# Patient Record
Sex: Female | Born: 1965 | Race: White | Hispanic: No | Marital: Married | Smoking: Never smoker
Health system: Southern US, Community
[De-identification: ages and names within clinical notes are randomized; demographics above are authoritative.]

---

## 2004-12-29 ENCOUNTER — Inpatient Hospital Stay: Payer: Self-pay | Admitting: Obstetrics and Gynecology

## 2005-01-05 ENCOUNTER — Ambulatory Visit: Payer: Self-pay | Admitting: Pediatrics

## 2006-02-20 ENCOUNTER — Ambulatory Visit: Payer: Self-pay | Admitting: Obstetrics and Gynecology

## 2008-05-29 ENCOUNTER — Ambulatory Visit: Payer: Self-pay | Admitting: Obstetrics and Gynecology

## 2008-06-10 ENCOUNTER — Ambulatory Visit: Payer: Self-pay | Admitting: Obstetrics and Gynecology

## 2009-06-18 ENCOUNTER — Ambulatory Visit: Payer: Self-pay | Admitting: Obstetrics and Gynecology

## 2010-06-29 ENCOUNTER — Ambulatory Visit: Payer: Self-pay | Admitting: Obstetrics and Gynecology

## 2010-07-13 ENCOUNTER — Ambulatory Visit: Payer: Self-pay | Admitting: Obstetrics and Gynecology

## 2011-08-29 ENCOUNTER — Ambulatory Visit: Payer: Self-pay | Admitting: Obstetrics and Gynecology

## 2012-09-12 ENCOUNTER — Ambulatory Visit: Payer: Self-pay | Admitting: Obstetrics and Gynecology

## 2012-09-13 ENCOUNTER — Ambulatory Visit: Payer: Self-pay | Admitting: Obstetrics and Gynecology

## 2013-09-17 ENCOUNTER — Ambulatory Visit: Payer: Self-pay | Admitting: Obstetrics and Gynecology

## 2014-01-21 DIAGNOSIS — G43909 Migraine, unspecified, not intractable, without status migrainosus: Secondary | ICD-10-CM | POA: Insufficient documentation

## 2014-09-18 ENCOUNTER — Ambulatory Visit: Payer: Self-pay | Admitting: Obstetrics and Gynecology

## 2015-03-26 ENCOUNTER — Other Ambulatory Visit: Payer: Self-pay | Admitting: Student

## 2015-03-26 DIAGNOSIS — M5440 Lumbago with sciatica, unspecified side: Secondary | ICD-10-CM

## 2015-03-26 DIAGNOSIS — M5432 Sciatica, left side: Secondary | ICD-10-CM

## 2015-03-26 DIAGNOSIS — M5442 Lumbago with sciatica, left side: Secondary | ICD-10-CM

## 2015-04-01 ENCOUNTER — Ambulatory Visit
Admission: RE | Admit: 2015-04-01 | Discharge: 2015-04-01 | Disposition: A | Discharge status: Home / Self Care | Payer: BC Managed Care – PPO | Source: Ambulatory Visit | Attending: Student | Admitting: Student

## 2015-04-01 DIAGNOSIS — M5127 Other intervertebral disc displacement, lumbosacral region: Secondary | ICD-10-CM | POA: Insufficient documentation

## 2015-04-01 DIAGNOSIS — G544 Lumbosacral root disorders, not elsewhere classified: Secondary | ICD-10-CM | POA: Insufficient documentation

## 2015-04-01 DIAGNOSIS — M5432 Sciatica, left side: Secondary | ICD-10-CM

## 2015-04-01 DIAGNOSIS — M5442 Lumbago with sciatica, left side: Secondary | ICD-10-CM

## 2015-04-01 DIAGNOSIS — M5440 Lumbago with sciatica, unspecified side: Secondary | ICD-10-CM

## 2015-04-28 ENCOUNTER — Ambulatory Visit: Payer: BC Managed Care – PPO | Admitting: Anesthesiology

## 2015-04-29 ENCOUNTER — Ambulatory Visit: Payer: BC Managed Care – PPO | Admitting: Anesthesiology

## 2015-06-26 DIAGNOSIS — M5416 Radiculopathy, lumbar region: Secondary | ICD-10-CM | POA: Insufficient documentation

## 2015-06-26 DIAGNOSIS — M5126 Other intervertebral disc displacement, lumbar region: Secondary | ICD-10-CM | POA: Insufficient documentation

## 2015-08-12 ENCOUNTER — Other Ambulatory Visit: Payer: Self-pay | Admitting: Obstetrics and Gynecology

## 2015-08-12 DIAGNOSIS — Z1231 Encounter for screening mammogram for malignant neoplasm of breast: Secondary | ICD-10-CM

## 2015-09-22 ENCOUNTER — Ambulatory Visit
Admission: RE | Admit: 2015-09-22 | Discharge: 2015-09-22 | Disposition: A | Discharge status: Home / Self Care | Payer: BC Managed Care – PPO | Source: Ambulatory Visit | Attending: Obstetrics and Gynecology | Admitting: Obstetrics and Gynecology

## 2015-09-22 DIAGNOSIS — Z1231 Encounter for screening mammogram for malignant neoplasm of breast: Secondary | ICD-10-CM | POA: Insufficient documentation

## 2016-09-16 ENCOUNTER — Other Ambulatory Visit: Payer: Self-pay | Admitting: Obstetrics and Gynecology

## 2016-09-16 DIAGNOSIS — Z1231 Encounter for screening mammogram for malignant neoplasm of breast: Secondary | ICD-10-CM

## 2016-10-10 ENCOUNTER — Ambulatory Visit
Admission: RE | Admit: 2016-10-10 | Discharge: 2016-10-10 | Disposition: A | Discharge status: Home / Self Care | Payer: BC Managed Care – PPO | Source: Ambulatory Visit | Attending: Obstetrics and Gynecology | Admitting: Obstetrics and Gynecology

## 2016-10-10 DIAGNOSIS — Z1231 Encounter for screening mammogram for malignant neoplasm of breast: Secondary | ICD-10-CM | POA: Diagnosis not present

## 2017-09-21 ENCOUNTER — Other Ambulatory Visit: Payer: Self-pay | Admitting: Obstetrics and Gynecology

## 2017-09-21 DIAGNOSIS — Z1231 Encounter for screening mammogram for malignant neoplasm of breast: Secondary | ICD-10-CM

## 2017-10-19 ENCOUNTER — Ambulatory Visit
Admission: RE | Admit: 2017-10-19 | Discharge: 2017-10-19 | Disposition: A | Discharge status: Home / Self Care | Payer: BC Managed Care – PPO | Source: Ambulatory Visit | Attending: Obstetrics and Gynecology | Admitting: Obstetrics and Gynecology

## 2017-10-19 DIAGNOSIS — Z1231 Encounter for screening mammogram for malignant neoplasm of breast: Secondary | ICD-10-CM | POA: Diagnosis present

## 2018-12-17 ENCOUNTER — Other Ambulatory Visit: Payer: Self-pay | Admitting: Obstetrics and Gynecology

## 2019-02-20 ENCOUNTER — Other Ambulatory Visit: Payer: Self-pay | Admitting: Obstetrics and Gynecology

## 2019-02-20 DIAGNOSIS — Z1231 Encounter for screening mammogram for malignant neoplasm of breast: Secondary | ICD-10-CM

## 2019-04-02 ENCOUNTER — Ambulatory Visit
Admission: RE | Admit: 2019-04-02 | Discharge: 2019-04-02 | Disposition: A | Discharge status: Home / Self Care | Payer: BC Managed Care – PPO | Source: Ambulatory Visit | Attending: Obstetrics and Gynecology | Admitting: Obstetrics and Gynecology

## 2019-04-02 DIAGNOSIS — Z1231 Encounter for screening mammogram for malignant neoplasm of breast: Secondary | ICD-10-CM | POA: Insufficient documentation

## 2019-10-26 ENCOUNTER — Ambulatory Visit: Payer: BC Managed Care – PPO | Attending: Internal Medicine

## 2019-10-26 DIAGNOSIS — Z23 Encounter for immunization: Secondary | ICD-10-CM | POA: Insufficient documentation

## 2019-10-26 NOTE — Progress Notes (Signed)
   Covid-19 Vaccination Clinic  Name:  Faith Banks    MRN: [id number] DOB: 04-Apr-1966  10/26/2019  Faith Banks was observed post Covid-19 immunization for 15 minutes without incidence. She was provided with Vaccine Information Sheet and instruction to access the V-Safe system.   Faith Banks was instructed to call 911 with any severe reactions post vaccine: Marland Kitchen Difficulty breathing  . Swelling of your face and throat  . A fast heartbeat  . A bad rash all over your body  . Dizziness and weakness    Immunizations Administered    Name Date Dose VIS Date Route   Moderna COVID-19 Vaccine 10/26/2019  3:59 PM 0.5 mL 07/30/2019 Intramuscular   Manufacturer: Moderna   Lot: [id number]   NDC: 66440-347-42

## 2019-11-23 ENCOUNTER — Ambulatory Visit: Payer: BC Managed Care – PPO | Attending: Internal Medicine

## 2019-11-23 DIAGNOSIS — Z23 Encounter for immunization: Secondary | ICD-10-CM

## 2019-11-23 NOTE — Progress Notes (Signed)
   Covid-19 Vaccination Clinic  Name:  Faith Banks    MRN: [id number] DOB: 1966/01/13  11/23/2019  [name] was observed post Covid-19 immunization for 15 minutes without incident. She was provided with Vaccine Information Sheet and instruction to access the V-Safe system.   [name] was instructed to call 911 with any severe reactions post vaccine: Marland Kitchen Difficulty breathing  . Swelling of face and throat  . A fast heartbeat  . A bad rash all over body  . Dizziness and weakness   Immunizations Administered    Name Date Dose VIS Date Route   Moderna COVID-19 Vaccine 11/23/2019 12:28 PM 0.5 mL 07/30/2019 Intramuscular   Manufacturer: Gala Murdoch   Lot: [id number]   NDC: 07371-062-69

## 2020-03-19 ENCOUNTER — Other Ambulatory Visit: Payer: Self-pay | Admitting: Obstetrics and Gynecology

## 2020-03-19 DIAGNOSIS — Z1231 Encounter for screening mammogram for malignant neoplasm of breast: Secondary | ICD-10-CM

## 2020-03-20 ENCOUNTER — Encounter: Payer: Self-pay | Admitting: Emergency Medicine

## 2020-03-20 ENCOUNTER — Ambulatory Visit
Admission: EM | Admit: 2020-03-20 | Discharge: 2020-03-20 | Disposition: A | Discharge status: Home / Self Care | Payer: BC Managed Care – PPO | Attending: Family Medicine | Admitting: Family Medicine

## 2020-03-20 ENCOUNTER — Other Ambulatory Visit: Payer: Self-pay

## 2020-03-20 ENCOUNTER — Ambulatory Visit (INDEPENDENT_AMBULATORY_CARE_PROVIDER_SITE_OTHER): Payer: BC Managed Care – PPO

## 2020-03-20 DIAGNOSIS — S46912A Strain of unspecified muscle, fascia and tendon at shoulder and upper arm level, left arm, initial encounter: Secondary | ICD-10-CM

## 2020-03-20 NOTE — ED Provider Notes (Signed)
[facility]    CSN: [id number] Arrival date & time: 03/20/20  1821      History   Chief Complaint Chief Complaint  Patient presents with  . Arm Pain    left upper arm  . Fall    HPI Faith Banks is a 54 y.o. female.   54 yo female with a c/o left arm pain after injuring it earlier today. States she was holding her dog's leash and her dog pulled her down causing her to land on her arm. Denies any numbness/tingling.      History reviewed. No pertinent past medical history.  There are no problems to display for this patient.   History reviewed. No pertinent surgical history.  OB History   No obstetric history on file.      Home Medications    Prior to Admission medications   Medication Sig Start Date End Date Taking? Authorizing Provider  Multiple Vitamin (MULTIVITAMIN) tablet Take 1 tablet by mouth daily.   Yes [provider]    Family History Family History  Problem Relation Age of Onset  . Healthy Mother   . Healthy Father   . Breast cancer Neg Hx     Social History Social History   Tobacco Use  . Smoking status: Never Smoker  . Smokeless tobacco: Never Used  Vaping Use  . Vaping Use: Never used  Substance Use Topics  . Alcohol use: Yes  . Drug use: Never     Allergies   Patient has no known allergies.   Review of Systems Review of Systems   Physical Exam Triage Vital Signs ED Triage Vitals  Enc Vitals Group     BP 03/20/20 1955 127/80     Pulse Rate 03/20/20 1955 82     Resp 03/20/20 1955 14     Temp 03/20/20 1955 98.3 F (36.8 C)     Temp Source 03/20/20 1955 Oral     SpO2 03/20/20 1955 100 %     Weight 03/20/20 1951 125 lb (56.7 kg)     Height 03/20/20 1951 5\' 5"  (1.651 m)     Head Circumference --      Peak Flow --      Pain Score 03/20/20 1951 8     Pain Loc --      Pain Edu? --      Excl. in GC? --    No data found.  Updated Vital Signs BP 127/80 (BP Location: Right Arm)   Pulse 82    Temp 98.3 F (36.8 C) (Oral)   Resp 14   Ht 5\' 5"  (1.651 m)   Wt 56.7 kg   SpO2 100%   BMI 20.80 kg/m   Visual Acuity Right Eye Distance:   Left Eye Distance:   Bilateral Distance:    Right Eye Near:   Left Eye Near:    Bilateral Near:     Physical Exam Vitals and nursing note reviewed.  Constitutional:      General: She is not in acute distress.    Appearance: She is not toxic-appearing or diaphoretic.  Musculoskeletal:     Left upper arm: Tenderness (over the proximal biceps and distal deltoid area) present. No swelling, edema, deformity or lacerations.     Comments: Left upper extremity neurovascularly intact  Neurological:     Mental Status: She is alert.      UC Treatments / Results  Labs (all labs ordered are listed, but only abnormal results are  displayed) Labs Reviewed - No data to display  EKG   Radiology No results found.  Procedures Procedures (including critical care time)  Medications Ordered in UC Medications - No data to display  Initial Impression / Assessment and Plan / UC Course  I have reviewed the triage vital signs and the nursing notes.  Pertinent labs & imaging results that were available during my care of the patient were reviewed by me and considered in my medical decision making (see chart for details).      Final Clinical Impressions(s) / UC Diagnoses   Final diagnoses:  Muscle strain of left upper arm, initial encounter     Discharge Instructions     Rest, ice, ibuprofen     ED Prescriptions    None      1. x-ray result (negative for acute fracture) and diagnosis reviewed with patient 2. Recommend supportive treatment as above 3. Follow-up prn if symptoms worsen or don't improve   PDMP not reviewed this encounter.   Payton Mccallum, MD 03/23/20 2126

## 2020-03-20 NOTE — Discharge Instructions (Addendum)
Rest, ice , ibuprofen.

## 2020-03-20 NOTE — ED Triage Notes (Signed)
Patient states that she was sitting outside in a chair holding her dog's leash when her dog took off to chase another dog and was pulled down on her left upper arm.  Patient c/o pain in her left upper arm.

## 2020-04-06 ENCOUNTER — Ambulatory Visit
Admission: RE | Admit: 2020-04-06 | Discharge: 2020-04-06 | Disposition: A | Discharge status: Home / Self Care | Payer: BC Managed Care – PPO | Source: Ambulatory Visit | Attending: Obstetrics and Gynecology | Admitting: Obstetrics and Gynecology

## 2020-04-06 ENCOUNTER — Other Ambulatory Visit: Payer: Self-pay

## 2020-04-06 DIAGNOSIS — Z1231 Encounter for screening mammogram for malignant neoplasm of breast: Secondary | ICD-10-CM | POA: Insufficient documentation

## 2021-04-01 ENCOUNTER — Other Ambulatory Visit: Payer: Self-pay | Admitting: Obstetrics and Gynecology

## 2021-04-01 DIAGNOSIS — Z1231 Encounter for screening mammogram for malignant neoplasm of breast: Secondary | ICD-10-CM

## 2021-04-07 ENCOUNTER — Ambulatory Visit
Admission: RE | Admit: 2021-04-07 | Discharge: 2021-04-07 | Disposition: A | Discharge status: Home / Self Care | Payer: BC Managed Care – PPO | Source: Ambulatory Visit | Attending: Obstetrics and Gynecology | Admitting: Obstetrics and Gynecology

## 2021-04-07 ENCOUNTER — Other Ambulatory Visit: Payer: Self-pay

## 2021-04-07 DIAGNOSIS — Z1231 Encounter for screening mammogram for malignant neoplasm of breast: Secondary | ICD-10-CM | POA: Diagnosis present

## 2022-04-18 ENCOUNTER — Ambulatory Visit: Payer: Self-pay | Admitting: Family Medicine

## 2022-05-31 LAB — RESULTS CONSOLE HPV: CHL HPV: NEGATIVE

## 2022-05-31 LAB — HM PAP SMEAR: HM Pap smear: NEGATIVE

## 2022-06-01 ENCOUNTER — Other Ambulatory Visit: Payer: Self-pay | Admitting: Obstetrics and Gynecology

## 2022-06-01 DIAGNOSIS — Z1231 Encounter for screening mammogram for malignant neoplasm of breast: Secondary | ICD-10-CM

## 2022-06-17 NOTE — Progress Notes (Signed)
New patient visit   Patient: Faith Banks   DOB: 11/12/1965   56 y.o. Female  MRN: [id number] Visit Date: 06/24/2022  Today's healthcare provider: Jacky Kindle, FNP  Introduced to nurse practitioner role and practice setting.  All questions answered.  Discussed provider/patient relationship and expectations.  I,Tiffany J Bragg,acting as a scribe for Jacky Kindle, FNP.,have documented all relevant documentation on the behalf of Jacky Kindle, FNP,as directed by  Jacky Kindle, FNP while in the presence of Jacky Kindle, FNP.   Chief Complaint  Patient presents with   Establish Care   Subjective    Faith Banks is a 56 y.o. female who presents today as a new patient to establish care.  HPI   History reviewed. No pertinent past medical history. History reviewed. No pertinent surgical history. Family Status  Relation Name Status   Mother  Alive   Father  Alive   MGM  (Not Specified)   PGF  (Not Specified)   Neg Hx  (Not Specified)   Family History  Problem Relation Age of Onset   Stroke Mother    Kidney Stones Mother    Skin cancer Father    Kidney Stones Father    Hypercholesterolemia Father    Colon cancer Maternal Grandmother    Emphysema Paternal Grandfather    Breast cancer Neg Hx    Social History   Socioeconomic History   Marital status: Married    Spouse name: Not on file   Number of children: Not on file   Years of education: Not on file   Highest education level: Not on file  Occupational History   Not on file  Tobacco Use   Smoking status: Never   Smokeless tobacco: Never  Vaping Use   Vaping Use: Never used  Substance and Sexual Activity   Alcohol use: Yes   Drug use: Never   Sexual activity: Not on file  Other Topics Concern   Not on file  Social History Narrative   Not on file   Social Determinants of Health   Financial Resource Strain: Not on file  Food Insecurity: Not on file  Transportation Needs: Not on file   Physical Activity: Not on file  Stress: Not on file  Social Connections: Not on file   Outpatient Medications Prior to Visit  Medication Sig   Multiple Vitamin (MULTIVITAMIN) tablet Take 1 tablet by mouth daily.   No facility-administered medications prior to visit.   No Known Allergies  Immunization History  Administered Date(s) Administered   Influenza-Unspecified 05/27/2021   Moderna Sars-Covid-2 Vaccination 10/26/2019, 11/23/2019   Tdap 06/24/2022   Zoster Recombinat (Shingrix) 06/24/2022    Health Maintenance  Topic Date Due   Hepatitis C Screening  Never done   PAP SMEAR-Modifier  Never done   COLONOSCOPY (Pts 45-44yrs Insurance coverage will need to be confirmed)  Never done   COVID-19 Vaccine (3 - Moderna series) 07/10/2022 (Originally 01/18/2020)   INFLUENZA VACCINE  11/27/2022 (Originally 03/29/2022)   Zoster Vaccines- Shingrix (2 of 2) 08/19/2022   MAMMOGRAM  04/08/2023   TETANUS/TDAP  06/24/2032   HIV Screening  Completed   HPV VACCINES  Aged Out    Patient Care Team: Jacky Kindle, FNP as PCP - General (Family Medicine)  Review of Systems  Psychiatric/Behavioral:  The patient is nervous/anxious.     Objective    BP 122/70 (BP Location: Right Arm, Patient Position: Sitting, Cuff Size: Normal)  Pulse 77   Resp 16   Ht 5\' 5"  (1.651 m)   Wt 123 lb (55.8 kg)   SpO2 100%   BMI 20.47 kg/m   Physical Exam Vitals and nursing note reviewed.  Constitutional:      General: She is not in acute distress.    Appearance: Normal appearance. She is normal weight. She is not ill-appearing, toxic-appearing or diaphoretic.  HENT:     Head: Normocephalic and atraumatic.  Cardiovascular:     Rate and Rhythm: Normal rate and regular rhythm.     Pulses: Normal pulses.     Heart sounds: Normal heart sounds. No murmur heard.    No friction rub. No gallop.  Pulmonary:     Effort: Pulmonary effort is normal. No respiratory distress.     Breath sounds: Normal breath  sounds. No stridor. No wheezing, rhonchi or rales.  Chest:     Chest wall: No tenderness.  Abdominal:     General: Bowel sounds are normal.     Palpations: Abdomen is soft.  Musculoskeletal:        General: No swelling, tenderness, deformity or signs of injury. Normal range of motion.     Right lower leg: No edema.     Left lower leg: No edema.  Skin:    General: Skin is warm and dry.     Capillary Refill: Capillary refill takes less than 2 seconds.     Coloration: Skin is not jaundiced or pale.     Findings: No bruising, erythema, lesion or rash.  Neurological:     General: No focal deficit present.     Mental Status: She is alert and oriented to person, place, and time. Mental status is at baseline.     Cranial Nerves: No cranial nerve deficit.     Sensory: No sensory deficit.     Motor: No weakness.     Coordination: Coordination normal.  Psychiatric:        Mood and Affect: Mood normal.        Behavior: Behavior normal.        Thought Content: Thought content normal.        Judgment: Judgment normal.     Depression Screen    06/24/2022    2:15 PM  PHQ 2/9 Scores  PHQ - 2 Score 0  PHQ- 9 Score 1   No results found for any visits on 06/24/22.  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      Relevant Orders   CBC with Differential/Platelet   Comprehensive Metabolic Panel (CMET)   Elevated LDL cholesterol level    Noted in 2022; was recommended to follow diet/exercise Repeat LP Discussed ASCVD risk calculations given previous data with today's BP      Relevant Orders   Lipid panel   Need for shingles vaccine   Relevant Orders   Zoster Recombinant (Shingrix ) (Completed)   Need for tetanus, diphtheria, and acellular pertussis (Tdap) vaccine    Relevant Orders   Tdap vaccine greater than or equal to 7yo IM (Completed)   Screening for diabetes mellitus    Continue to recommend balanced, lower carb meals. Smaller meal size, adding snacks. Choosing water as drink  of choice and increasing purposeful exercise. Recommend initial screening given age and elevated LDL cholesterol in 2022.      Relevant Orders   Hemoglobin A1c   Other Visit Diagnoses     Encounter for screening for HIV       Relevant Orders   HIV antibody (with reflex)   Encounter for hepatitis C screening test for low risk patient       Relevant Orders   Hepatitis C Antibody      Return in about 1 year (around 06/25/2023) for annual examination.     Vonna Kotyk, FNP, have reviewed all documentation for this visit. The documentation on 06/24/22 for the exam, diagnosis, procedures, and orders are all accurate and complete.  Gwyneth Sprout, [place] [phone] (phone) [phone] (fax)  [place]

## 2022-06-24 ENCOUNTER — Ambulatory Visit: Payer: BC Managed Care – PPO | Admitting: Family Medicine

## 2022-06-24 ENCOUNTER — Encounter: Payer: Self-pay | Admitting: Family Medicine

## 2022-06-24 VITALS — BP 122/70 | HR 77 | Resp 16 | Ht 65.0 in | Wt 123.0 lb

## 2022-06-24 DIAGNOSIS — F419 Anxiety disorder, unspecified: Secondary | ICD-10-CM | POA: Diagnosis not present

## 2022-06-24 DIAGNOSIS — Z23 Encounter for immunization: Secondary | ICD-10-CM | POA: Insufficient documentation

## 2022-06-24 DIAGNOSIS — Z1159 Encounter for screening for other viral diseases: Secondary | ICD-10-CM

## 2022-06-24 DIAGNOSIS — E7800 Pure hypercholesterolemia, unspecified: Secondary | ICD-10-CM | POA: Insufficient documentation

## 2022-06-24 DIAGNOSIS — Z114 Encounter for screening for human immunodeficiency virus [HIV]: Secondary | ICD-10-CM

## 2022-06-24 DIAGNOSIS — Z131 Encounter for screening for diabetes mellitus: Secondary | ICD-10-CM | POA: Insufficient documentation

## 2022-06-24 NOTE — Assessment & Plan Note (Signed)
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

## 2022-06-24 NOTE — Assessment & Plan Note (Signed)
Continue to recommend balanced, lower carb meals. Smaller meal size, adding snacks. Choosing water as drink of choice and increasing purposeful exercise. Recommend initial screening given age and elevated LDL cholesterol in 2022.

## 2022-06-24 NOTE — Assessment & Plan Note (Signed)
Noted in 2022; was recommended to follow diet/exercise Repeat LP Discussed ASCVD risk calculations given previous data with today's BP

## 2022-06-25 LAB — HIV ANTIBODY (ROUTINE TESTING W REFLEX): HIV Screen 4th Generation wRfx: NONREACTIVE

## 2022-06-25 LAB — HEPATITIS C ANTIBODY: Hep C Virus Ab: NONREACTIVE

## 2022-06-25 LAB — COMPREHENSIVE METABOLIC PANEL
ALT: 18 IU/L (ref 0–32)
AST: 23 IU/L (ref 0–40)
Albumin/Globulin Ratio: 2.2 (ref 1.2–2.2)
Albumin: 4.9 g/dL (ref 3.8–4.9)
Alkaline Phosphatase: 81 IU/L (ref 44–121)
BUN/Creatinine Ratio: 18 (ref 9–23)
BUN: 13 mg/dL (ref 6–24)
Bilirubin Total: 0.2 mg/dL (ref 0.0–1.2)
CO2: 24 mmol/L (ref 20–29)
Calcium: 10 mg/dL (ref 8.7–10.2)
Chloride: 102 mmol/L (ref 96–106)
Creatinine, Ser: 0.72 mg/dL (ref 0.57–1.00)
Globulin, Total: 2.2 g/dL (ref 1.5–4.5)
Glucose: 90 mg/dL (ref 70–99)
Potassium: 4.4 mmol/L (ref 3.5–5.2)
Sodium: 140 mmol/L (ref 134–144)
Total Protein: 7.1 g/dL (ref 6.0–8.5)
eGFR: 99 mL/min/{1.73_m2} (ref >59)

## 2022-06-25 LAB — CBC WITH DIFFERENTIAL/PLATELET
Basophils Absolute: 0.1 10*3/uL (ref 0.0–0.2)
Basos: 1 %
EOS (ABSOLUTE): 0.1 10*3/uL (ref 0.0–0.4)
Eos: 2 %
Hematocrit: 38.9 % (ref 34.0–46.6)
Hemoglobin: 13 g/dL (ref 11.1–15.9)
Immature Grans (Abs): 0 10*3/uL (ref 0.0–0.1)
Immature Granulocytes: 0 %
Lymphocytes Absolute: 1.8 10*3/uL (ref 0.7–3.1)
Lymphs: 30 %
MCH: 29 pg (ref 26.6–33.0)
MCHC: 33.4 g/dL (ref 31.5–35.7)
MCV: 87 fL (ref 79–97)
Monocytes Absolute: 0.5 10*3/uL (ref 0.1–0.9)
Monocytes: 8 %
Neutrophils Absolute: 3.7 10*3/uL (ref 1.4–7.0)
Neutrophils: 59 %
Platelets: 288 10*3/uL (ref 150–450)
RBC: 4.48 x10E6/uL (ref 3.77–5.28)
RDW: 11.5 % — ABNORMAL LOW (ref 11.7–15.4)
WBC: 6.1 10*3/uL (ref 3.4–10.8)

## 2022-06-25 LAB — LIPID PANEL
Chol/HDL Ratio: 2.7 ratio (ref 0.0–4.4)
Cholesterol, Total: 197 mg/dL (ref 100–199)
HDL: 74 mg/dL (ref >39)
LDL Chol Calc (NIH): 111 mg/dL — ABNORMAL HIGH (ref 0–99)
Triglycerides: 65 mg/dL (ref 0–149)
VLDL Cholesterol Cal: 12 mg/dL (ref 5–40)

## 2022-06-25 LAB — HEMOGLOBIN A1C
Est. average glucose Bld gHb Est-mCnc: 111 mg/dL
Hgb A1c MFr Bld: 5.5 % (ref 4.8–5.6)

## 2022-06-26 NOTE — Progress Notes (Signed)
LDL/bad cholesterol remains elevated. I recommend diet low in saturated fat and regular exercise - 30 min at least 5 times per week. Here is your revised ASCVD risk.   The 10-year ASCVD risk score (Arnett DK, et al., 2019) is: 1.2%   Values used to calculate the score:     Age: 56 years     Sex: Female     Is Non-Hispanic African American: No     Diabetic: No     Tobacco smoker: No     Systolic Blood Pressure: 867 mmHg     Is BP treated: No     HDL Cholesterol: 74 mg/dL     Total Cholesterol: 197 mg/dL  All other labs are normal and stable.  Gwyneth Sprout, [address] [place], [place] [address] [phone] (phone) [phone] (fax) [place]

## 2022-06-29 ENCOUNTER — Ambulatory Visit
Admission: RE | Admit: 2022-06-29 | Discharge: 2022-06-29 | Disposition: A | Discharge status: Home / Self Care | Payer: BC Managed Care – PPO | Source: Ambulatory Visit | Attending: Obstetrics and Gynecology | Admitting: Obstetrics and Gynecology

## 2022-06-29 DIAGNOSIS — Z1231 Encounter for screening mammogram for malignant neoplasm of breast: Secondary | ICD-10-CM | POA: Diagnosis present

## 2022-08-13 IMAGING — MG MM DIGITAL SCREENING BILAT W/ TOMO AND CAD
6 of 10 series · 6 of 30 positions shown · non-contrast
Comparison: Previous exam(s).

CLINICAL DATA: Screening.

EXAM:
DIGITAL SCREENING BILATERAL MAMMOGRAM WITH TOMOSYNTHESIS AND CAD
TECHNIQUE: Bilateral screening digital craniocaudal and mediolateral oblique
mammograms were obtained. Bilateral screening digital breast
tomosynthesis was performed. The images were evaluated with
computer-aided detection.

[R MLO synth-2D (1 of 2)]
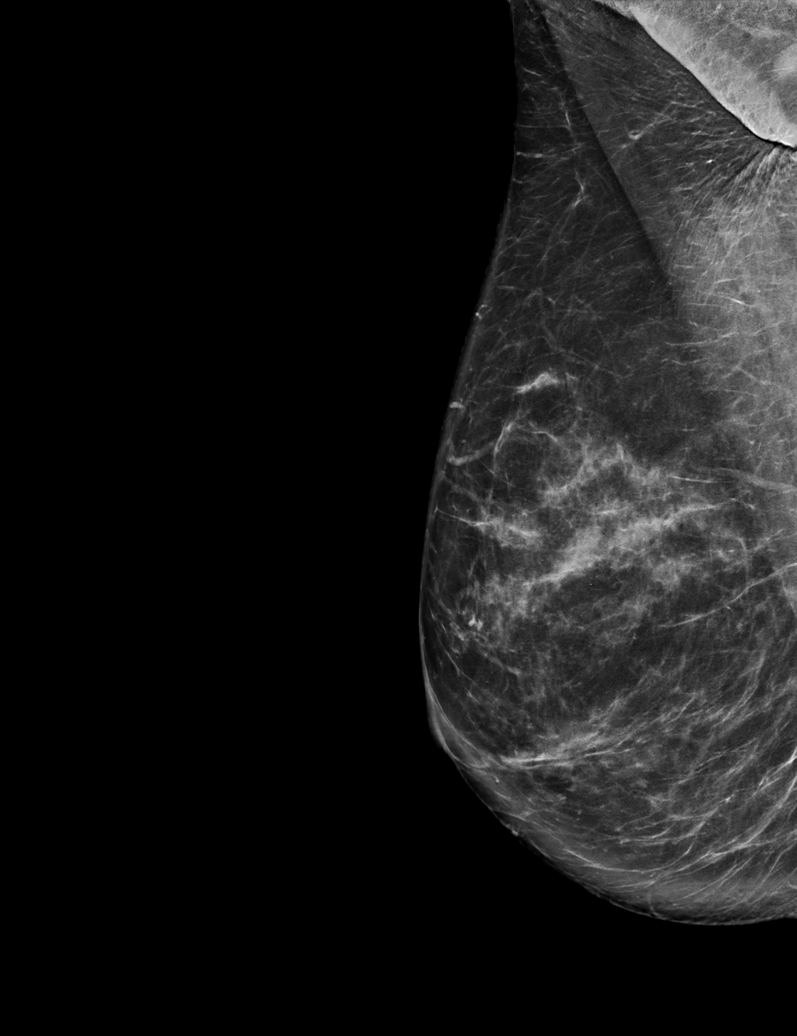

[R CC synth-2D]
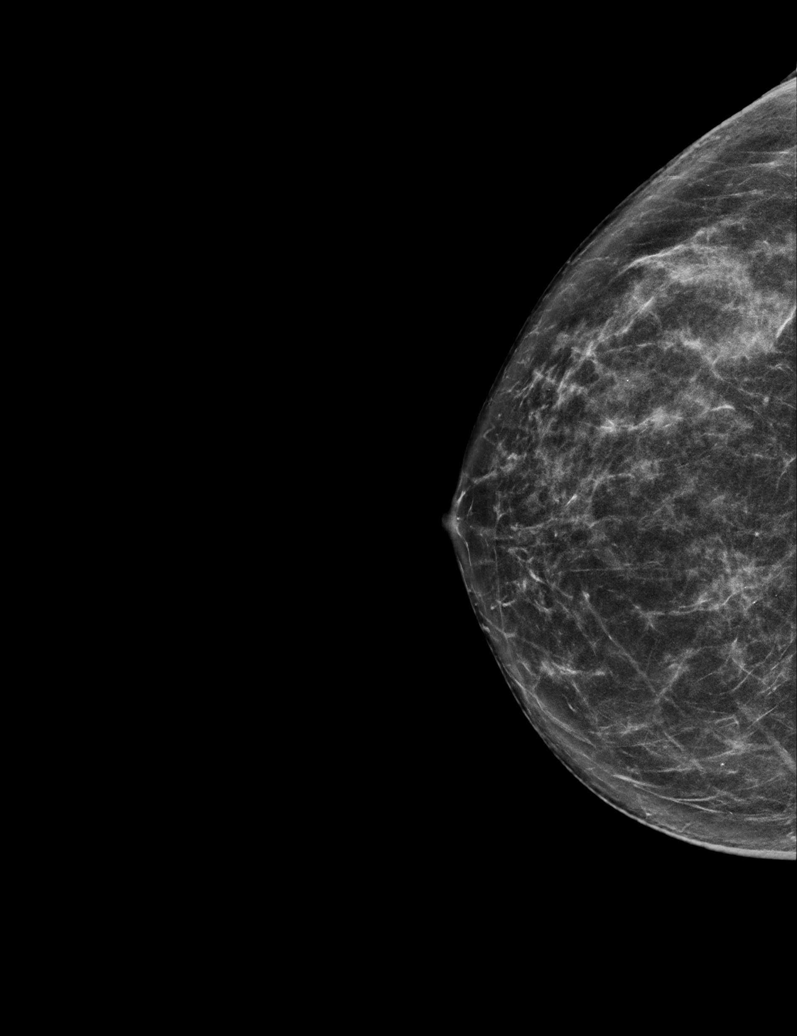

[L CC synth-2D]
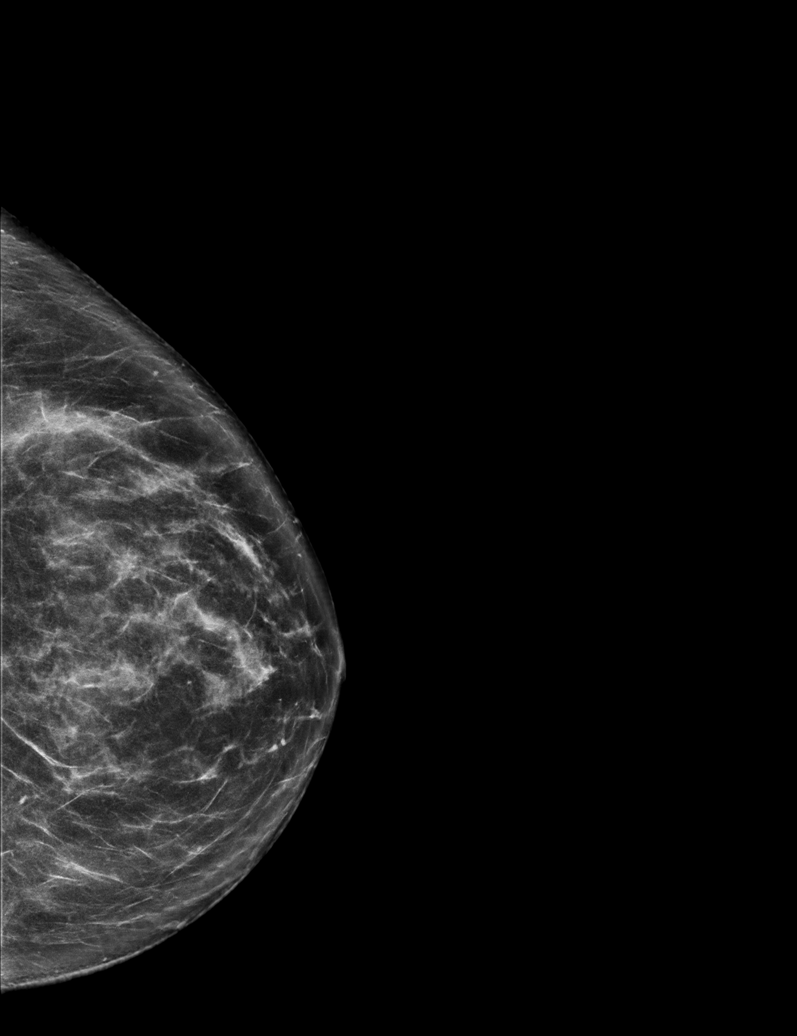

[R MLO synth-2D (2 of 2)]
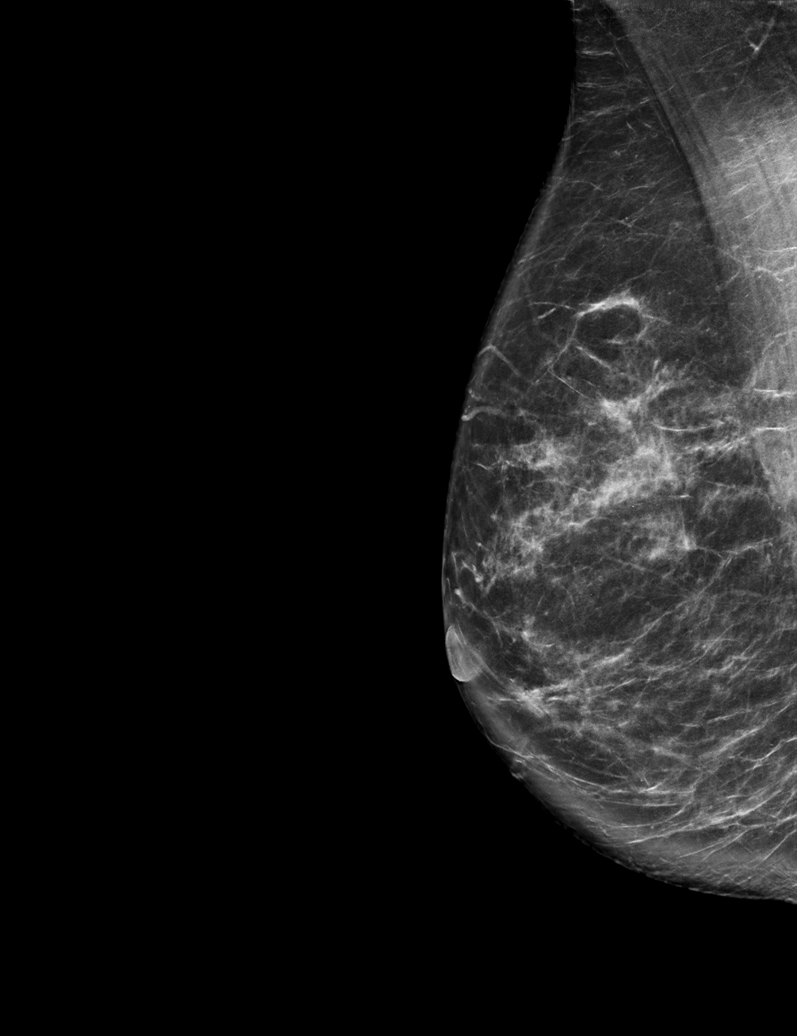

[L MLO synth-2D]
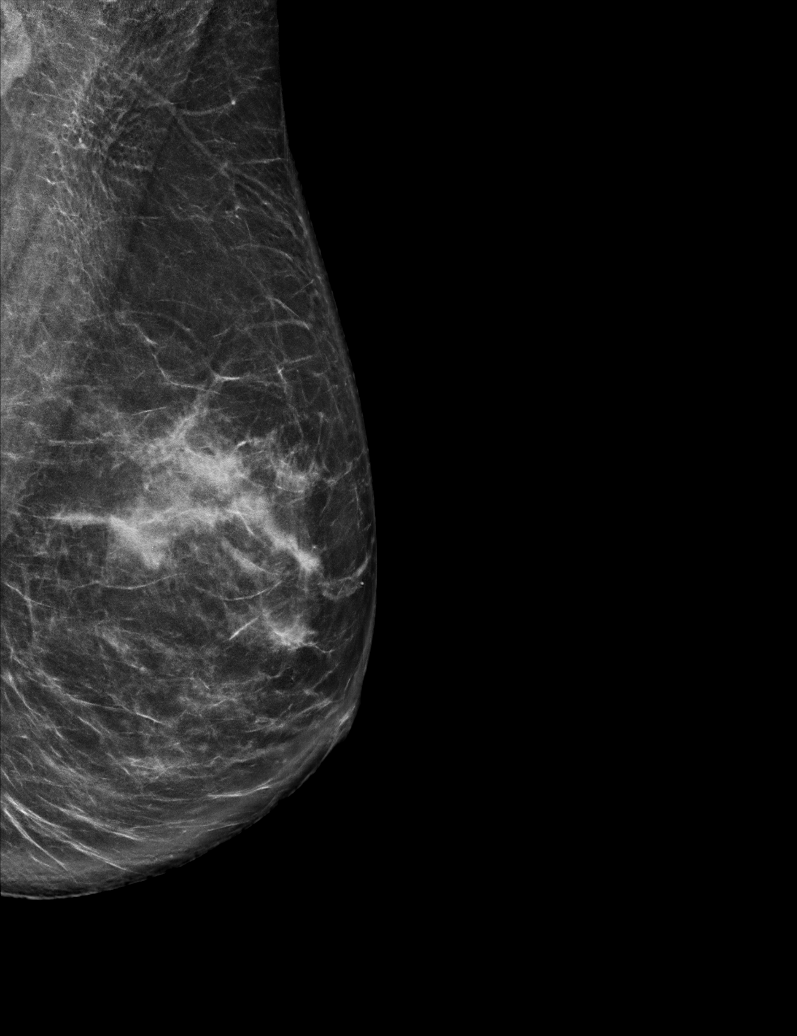

[L MLO tomo · tomo slice 37/74.0]
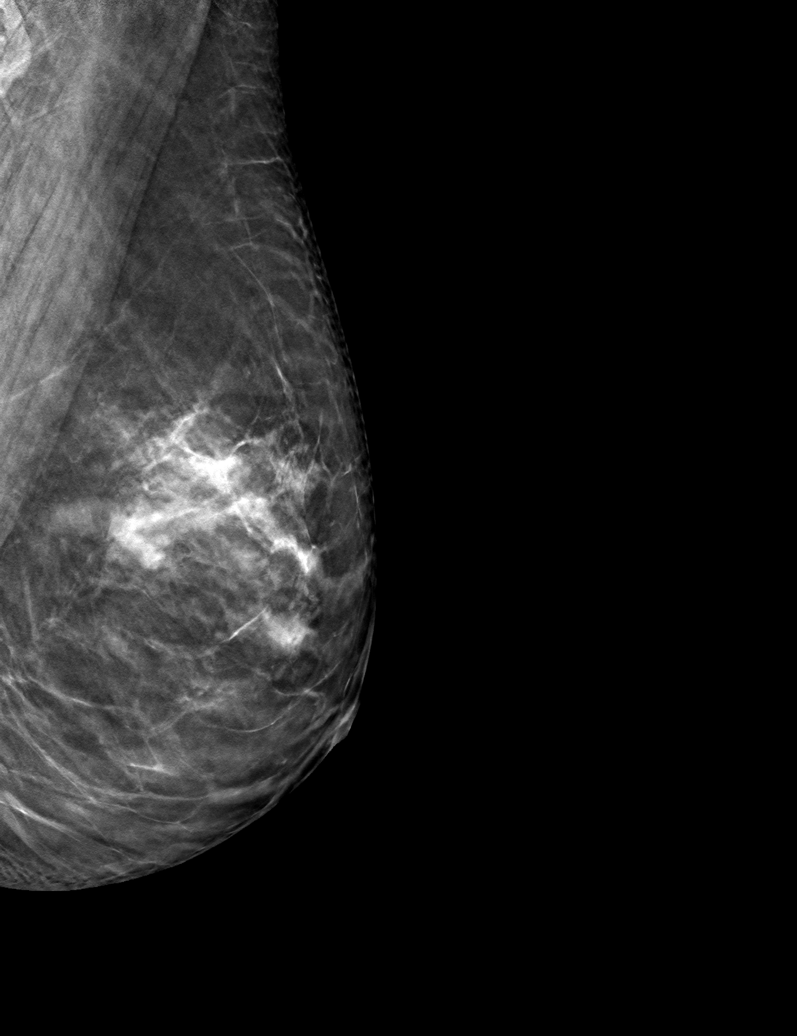

[6 of 30 positions shown; findings below may reference images not displayed]

ACR Breast Density Category b: There are scattered areas of
fibroglandular density.
FINDINGS: There are no findings suspicious for malignancy.
IMPRESSION: No mammographic evidence of malignancy. A result letter of this
screening mammogram will be mailed directly to the patient.

RECOMMENDATION:
Screening mammogram in one year. (Code:51-O-LD2)

BI-RADS CATEGORY  1: Negative.

## 2022-10-12 ENCOUNTER — Telehealth: Payer: Self-pay

## 2022-10-12 NOTE — Telephone Encounter (Signed)
Copied from [place] [id number]. Topic: Appointment Scheduling - Scheduling Inquiry for Clinic >> Oct 12, 2022  4:05 PM Faith Banks wrote: Reason for CRM: pt called in to schedule a shingles shot.  Please call back

## 2022-10-13 NOTE — Telephone Encounter (Signed)
Appt scheduled for 10/21/2022 at 320pm

## 2022-10-21 ENCOUNTER — Ambulatory Visit (INDEPENDENT_AMBULATORY_CARE_PROVIDER_SITE_OTHER): Payer: BC Managed Care – PPO | Admitting: Family Medicine

## 2022-10-21 DIAGNOSIS — Z23 Encounter for immunization: Secondary | ICD-10-CM

## 2022-10-21 NOTE — Progress Notes (Signed)
Patient present for vaccination; completed without complication by CMA alone.  Faith Banks, [address] [place], [place] [address] [phone] (phone) [phone] (fax) [place]

## 2022-10-28 ENCOUNTER — Ambulatory Visit: Payer: BC Managed Care – PPO | Admitting: Physician Assistant

## 2023-06-27 ENCOUNTER — Ambulatory Visit (INDEPENDENT_AMBULATORY_CARE_PROVIDER_SITE_OTHER): Payer: BC Managed Care – PPO | Admitting: Family Medicine

## 2023-06-27 ENCOUNTER — Encounter: Payer: Self-pay | Admitting: Family Medicine

## 2023-06-27 VITALS — BP 97/72 | HR 72 | Ht 65.0 in | Wt 122.0 lb

## 2023-06-27 DIAGNOSIS — Z1231 Encounter for screening mammogram for malignant neoplasm of breast: Secondary | ICD-10-CM | POA: Insufficient documentation

## 2023-06-27 DIAGNOSIS — E7800 Pure hypercholesterolemia, unspecified: Secondary | ICD-10-CM

## 2023-06-27 DIAGNOSIS — Z0001 Encounter for general adult medical examination with abnormal findings: Secondary | ICD-10-CM | POA: Diagnosis not present

## 2023-06-27 DIAGNOSIS — N951 Menopausal and female climacteric states: Secondary | ICD-10-CM | POA: Diagnosis not present

## 2023-06-27 DIAGNOSIS — Z23 Encounter for immunization: Secondary | ICD-10-CM | POA: Diagnosis not present

## 2023-06-27 DIAGNOSIS — R7303 Prediabetes: Secondary | ICD-10-CM | POA: Diagnosis not present

## 2023-06-27 DIAGNOSIS — Z0000 Encounter for general adult medical examination without abnormal findings: Secondary | ICD-10-CM | POA: Insufficient documentation

## 2023-06-27 DIAGNOSIS — F39 Unspecified mood [affective] disorder: Secondary | ICD-10-CM | POA: Insufficient documentation

## 2023-06-27 DIAGNOSIS — Z1211 Encounter for screening for malignant neoplasm of colon: Secondary | ICD-10-CM | POA: Insufficient documentation

## 2023-06-27 NOTE — Assessment & Plan Note (Signed)
Previously elevation without need for statin; repeat LP The 10-year ASCVD risk score (Arnett DK, et al., 2019) is: 0.9% I continue to recommend diet low in saturated fat and regular exercise - 30 min at least 5 times per week

## 2023-06-27 NOTE — Assessment & Plan Note (Signed)
Due for screening for mammogram, denies breast concerns, provided with phone number to call and schedule appointment for mammogram. Encouraged to repeat breast cancer screening every 1-2 years.  

## 2023-06-27 NOTE — Progress Notes (Signed)
Complete physical exam  Patient: Faith Banks   DOB: [birth year]   57 y.o. Female  MRN: [id number] Visit Date: 06/27/2023  Today's healthcare provider: Jacky Kindle, FNP  Re-introduced to nurse practitioner role and practice setting.  All questions answered.  Discussed provider/patient relationship and expectations.  Chief Complaint  Patient presents with   Annual Exam    No record of last physical exam  Diet - Well balanced Exercise - Tries walking as often as she can  Feeling - Well Sleeping - Well, reports waking up more often than she use to but able to go back to sleep Concerns - None   Subjective    Faith Banks is a 57 y.o. female who presents today for a complete physical exam.   HPI HPI     Annual Exam    Additional comments: No record of last physical exam  Diet - Well balanced Exercise - Tries walking as often as she can  Feeling - Well Sleeping - Well, reports waking up more often than she use to but able to go back to sleep Concerns - None      Last edited by Acey Lav, CMA on 06/27/2023  9:30 AM.      History reviewed. No pertinent past medical history. History reviewed. No pertinent surgical history. Social History   Socioeconomic History   Marital status: Married    Spouse name: Not on file   Number of children: Not on file   Years of education: Not on file   Highest education level: Not on file  Occupational History   Not on file  Tobacco Use   Smoking status: Never   Smokeless tobacco: Never  Vaping Use   Vaping status: Never Used  Substance and Sexual Activity   Alcohol use: Yes    Comment: A few deinks a month   Drug use: Never   Sexual activity: Yes    Birth control/protection: Surgical  Other Topics Concern   Not on file  Social History Narrative   Not on file   Social Determinants of Health   Financial Resource Strain: Not on file  Food Insecurity: Not on file  Transportation Needs: Not on file   Physical Activity: Not on file  Stress: Not on file  Social Connections: Not on file  Intimate Partner Violence: Not on file   Family Status  Relation Name Status   Mother  Alive   Father  Alive   MGM Shary Decamp (Not Specified)   PGF  (Not Specified)   Neg Hx  (Not Specified)  No partnership data on file   Family History  Problem Relation Age of Onset   Stroke Mother    Kidney Stones Mother    Skin cancer Father    Kidney Stones Father    Hypercholesterolemia Father    Colon cancer Maternal Grandmother    Cancer Maternal Grandmother    Stroke Maternal Grandmother    Emphysema Paternal Grandfather    Breast cancer Neg Hx    No Known Allergies  Patient Care Team: Jacky Kindle, FNP as PCP - General (Family Medicine) [facility] Christeen Douglas, MD as Consulting Physician (Obstetrics and Gynecology)   Medications: Outpatient Medications Prior to Visit  Medication Sig   clobetasol cream (TEMOVATE) 0.05 % Apply 1 Application topically 2 (two) times daily.   Multiple Vitamin (MULTIVITAMIN) tablet Take 1 tablet by mouth daily.   No facility-administered medications prior to visit.  Review of Systems    Objective    BP 97/72   Pulse 72   Ht 5\' 5"  (1.651 m)   Wt 122 lb (55.3 kg)   SpO2 100%   BMI 20.30 kg/m    Physical Exam Vitals and nursing note reviewed.  Constitutional:      General: She is awake. She is not in acute distress.    Appearance: Normal appearance. She is well-developed, well-groomed and normal weight. She is not ill-appearing, toxic-appearing or diaphoretic.  HENT:     Head: Normocephalic and atraumatic.     Jaw: There is normal jaw occlusion. No trismus, tenderness, swelling or pain on movement.     Right Ear: Hearing, tympanic membrane, ear canal and external ear normal. There is no impacted cerumen.     Left Ear: Hearing, tympanic membrane, ear canal and external ear normal. There is no impacted cerumen.     Nose: Nose  normal. No congestion or rhinorrhea.     Right Turbinates: Not enlarged, swollen or pale.     Left Turbinates: Not enlarged, swollen or pale.     Right Sinus: No maxillary sinus tenderness or frontal sinus tenderness.     Left Sinus: No maxillary sinus tenderness or frontal sinus tenderness.     Mouth/Throat:     Lips: Pink.     Mouth: Mucous membranes are moist. No injury.     Tongue: No lesions.     Pharynx: Oropharynx is clear. Uvula midline. No pharyngeal swelling, oropharyngeal exudate, posterior oropharyngeal erythema or uvula swelling.     Tonsils: No tonsillar exudate or tonsillar abscesses.  Eyes:     General: Lids are normal. Lids are everted, no foreign bodies appreciated. Vision grossly intact. Gaze aligned appropriately. No allergic shiner or visual field deficit.       Right eye: No discharge.        Left eye: No discharge.     Extraocular Movements: Extraocular movements intact.     Conjunctiva/sclera: Conjunctivae normal.     Right eye: Right conjunctiva is not injected. No exudate.    Left eye: Left conjunctiva is not injected. No exudate.    Pupils: Pupils are equal, round, and reactive to light.  Neck:     Thyroid: No thyroid mass, thyromegaly or thyroid tenderness.     Vascular: No carotid bruit.     Trachea: Trachea normal.  Cardiovascular:     Rate and Rhythm: Normal rate and regular rhythm.     Pulses: Normal pulses.          Carotid pulses are 2+ on the right side and 2+ on the left side.      Radial pulses are 2+ on the right side and 2+ on the left side.       Dorsalis pedis pulses are 2+ on the right side and 2+ on the left side.       Posterior tibial pulses are 2+ on the right side and 2+ on the left side.     Heart sounds: Normal heart sounds, S1 normal and S2 normal. No murmur heard.    No friction rub. No gallop.  Pulmonary:     Effort: Pulmonary effort is normal. No respiratory distress.     Breath sounds: Normal breath sounds and air entry. No  stridor. No wheezing, rhonchi or rales.  Chest:     Chest wall: No tenderness.  Abdominal:     General: Abdomen is flat. Bowel sounds are normal. There is  no distension.     Palpations: Abdomen is soft. There is no mass.     Tenderness: There is no abdominal tenderness. There is no right CVA tenderness, left CVA tenderness, guarding or rebound.     Hernia: No hernia is present.  Genitourinary:    Comments: Exam deferred; UTD on PAP. Stable complaints of age related vaginal dryness. Musculoskeletal:        General: No swelling, tenderness, deformity or signs of injury. Normal range of motion.     Cervical back: Full passive range of motion without pain, normal range of motion and neck supple. No edema, rigidity or tenderness. No muscular tenderness.     Right lower leg: No edema.     Left lower leg: No edema.  Lymphadenopathy:     Cervical: No cervical adenopathy.     Right cervical: No superficial, deep or posterior cervical adenopathy.    Left cervical: No superficial, deep or posterior cervical adenopathy.  Skin:    General: Skin is warm and dry.     Capillary Refill: Capillary refill takes less than 2 seconds.     Coloration: Skin is not jaundiced or pale.     Findings: No bruising, erythema, lesion or rash.  Neurological:     General: No focal deficit present.     Mental Status: She is alert and oriented to person, place, and time. Mental status is at baseline.     GCS: GCS eye subscore is 4. GCS verbal subscore is 5. GCS motor subscore is 6.     Sensory: Sensation is intact. No sensory deficit.     Motor: Motor function is intact. No weakness.     Coordination: Coordination is intact. Coordination normal.     Gait: Gait is intact. Gait normal.  Psychiatric:        Attention and Perception: Attention and perception normal.        Mood and Affect: Mood and affect normal.        Speech: Speech normal.        Behavior: Behavior normal. Behavior is cooperative.        Thought  Content: Thought content normal.        Cognition and Memory: Cognition and memory normal.        Judgment: Judgment normal.     Last depression screening scores    06/27/2023    9:30 AM 06/24/2022    2:15 PM  PHQ 2/9 Scores  PHQ - 2 Score 0 0  PHQ- 9 Score  1   Last fall risk screening    06/27/2023    9:30 AM  Fall Risk   Falls in the past year? 0  Number falls in past yr: 0  Injury with Fall? 0  Risk for fall due to : No Fall Risks  Follow up Falls evaluation completed   Last Audit-C alcohol use screening    06/24/2022    2:16 PM  Alcohol Use Disorder Test (AUDIT)  1. How often do you have a drink containing alcohol? 3  2. How many drinks containing alcohol do you have on a typical day when you are drinking? 0  3. How often do you have six or more drinks on one occasion? 0  AUDIT-C Score 3   A score of 3 or more in women, and 4 or more in men indicates increased risk for alcohol abuse, EXCEPT if all of the points are from question 1   Results for orders placed or  performed in visit on 06/27/23  HM PAP SMEAR  Result Value Ref Range   HM Pap smear neg   Results Console HPV  Result Value Ref Range   CHL HPV Negative     Assessment & Plan    Routine Health Maintenance and Physical Exam  Exercise Activities and Dietary recommendations  Goals   None     Immunization History  Administered Date(s) Administered   Influenza, Seasonal, Injecte, Preservative Fre 06/27/2023   Influenza-Unspecified 05/27/2021   Moderna Sars-Covid-2 Vaccination 10/26/2019, 11/23/2019   Tdap 06/24/2022   Zoster Recombinant(Shingrix) 06/24/2022, 10/21/2022    Health Maintenance  Topic Date Due   Colonoscopy  Never done   COVID-19 Vaccine (3 - 2023-24 season) 04/30/2023   MAMMOGRAM  06/29/2024   Cervical Cancer Screening (HPV/Pap Cotest)  06/01/2027   DTaP/Tdap/Td (2 - Td or Tdap) 06/24/2032   INFLUENZA VACCINE  Completed   Hepatitis C Screening  Completed   HIV Screening   Completed   Zoster Vaccines- Shingrix  Completed   HPV VACCINES  Aged Out    Discussed health benefits of physical activity, and encouraged her to engage in regular exercise appropriate for her age and condition.  Problem List Items Addressed This Visit       Other   Annual physical exam - Primary    Things to do to keep yourself healthy  - Exercise at least 30-45 minutes a day, 3-4 days a week.  - Eat a low-fat diet with lots of fruits and vegetables, up to 7-9 servings per day.  - Seatbelts can save your life. Wear them always.  - Smoke detectors on every level of your home, check batteries every year.  - Eye Doctor - have an eye exam every 1-2 years  - Safe sex - if you may be exposed to STDs, use a condom.  - Alcohol -  If you drink, do it moderately, less than 2 drinks per day.  - Health Care Power of Attorney. Choose someone to speak for you if you are not able.  - Depression is common in our stressful world.If you're feeling down or losing interest in things you normally enjoy, please come in for a visit.  - Violence - If anyone is threatening or hurting you, please call immediately.       Relevant Orders   CBC with Differential/Platelet   Comprehensive Metabolic Panel (CMET)   TSH   Hemoglobin A1c   Lipid panel   Vitamin D (25 hydroxy)   Borderline diabetes    Previous A1c at 5.5%; repeat A1c Continue to recommend balanced, lower carb meals. Smaller meal size, adding snacks. Choosing water as drink of choice and increasing purposeful exercise.       Relevant Orders   Hemoglobin A1c   Elevated LDL cholesterol level    Previously elevation without need for statin; repeat LP The 10-year ASCVD risk score (Arnett DK, et al., 2019) is: 0.9% I continue to recommend diet low in saturated fat and regular exercise - 30 min at least 5 times per week       Relevant Orders   Lipid panel   Immunization due   Relevant Orders   Flu vaccine trivalent PF, 6mos and  older(Flulaval,Afluria,Fluarix,Fluzone) (Completed)   Influenza vaccine needed   Relevant Orders   Flu vaccine trivalent PF, 6mos and older(Flulaval,Afluria,Fluarix,Fluzone) (Completed)   Perimenopausal    Ongoing complaints of impaired sleep patterns, irritability/short fuse and vaginal dryness Continue to monitor  Relevant Orders   Vitamin D (25 hydroxy)   RESOLVED: Screen for colon cancer   Screening mammogram for breast cancer    Due for screening for mammogram, denies breast concerns, provided with phone number to call and schedule appointment for mammogram. Encouraged to repeat breast cancer screening every 1-2 years.       Relevant Orders   MM 3D SCREENING MAMMOGRAM BILATERAL BREAST   Return in about 1 year (around 06/26/2024) for annual examination.    Leilani Merl, FNP, have reviewed all documentation for this visit. The documentation on 06/27/23 for the exam, diagnosis, procedures, and orders are all accurate and complete.  Jacky Kindle, FNP  [facility] [phone] (phone) [phone] (fax)  [facility]

## 2023-06-27 NOTE — Assessment & Plan Note (Signed)
Ongoing complaints of impaired sleep patterns, irritability/short fuse and vaginal dryness Continue to monitor

## 2023-06-27 NOTE — Assessment & Plan Note (Signed)
Previous A1c at 5.5%; repeat A1c Continue to recommend balanced, lower carb meals. Smaller meal size, adding snacks. Choosing water as drink of choice and increasing purposeful exercise.

## 2023-06-27 NOTE — Assessment & Plan Note (Signed)

## 2023-06-27 NOTE — Patient Instructions (Addendum)
Please call and schedule your mammogram, 07/01/23 or later  [facility]  [address], [address] [facility] [place],  [place]  [address] Get Driving Directions Main: [phone]  Sunday:Closed Monday:7:20 AM - 5:00 PM Tuesday:7:20 AM - 5:00 PM Wednesday:7:20 AM - 5:00 PM Thursday:7:20 AM - 5:00 PM Friday:7:20 AM - 4:30 PM Saturday:Closed

## 2023-06-28 LAB — CBC WITH DIFFERENTIAL/PLATELET
Basophils Absolute: 0 10*3/uL (ref 0.0–0.2)
Basos: 1 %
EOS (ABSOLUTE): 0.2 10*3/uL (ref 0.0–0.4)
Eos: 3 %
Hematocrit: 40.9 % (ref 34.0–46.6)
Hemoglobin: 13.5 g/dL (ref 11.1–15.9)
Immature Grans (Abs): 0 10*3/uL (ref 0.0–0.1)
Immature Granulocytes: 0 %
Lymphocytes Absolute: 1.7 10*3/uL (ref 0.7–3.1)
Lymphs: 35 %
MCH: 30.1 pg (ref 26.6–33.0)
MCHC: 33 g/dL (ref 31.5–35.7)
MCV: 91 fL (ref 79–97)
Monocytes Absolute: 0.5 10*3/uL (ref 0.1–0.9)
Monocytes: 10 %
Neutrophils Absolute: 2.5 10*3/uL (ref 1.4–7.0)
Neutrophils: 51 %
Platelets: 304 10*3/uL (ref 150–450)
RBC: 4.48 x10E6/uL (ref 3.77–5.28)
RDW: 11.5 % — ABNORMAL LOW (ref 11.7–15.4)
WBC: 5 10*3/uL (ref 3.4–10.8)

## 2023-06-28 LAB — HEMOGLOBIN A1C
Est. average glucose Bld gHb Est-mCnc: 111 mg/dL
Hgb A1c MFr Bld: 5.5 % (ref 4.8–5.6)

## 2023-06-28 LAB — COMPREHENSIVE METABOLIC PANEL
ALT: 18 [IU]/L (ref 0–32)
AST: 24 [IU]/L (ref 0–40)
Albumin: 4.6 g/dL (ref 3.8–4.9)
Alkaline Phosphatase: 89 [IU]/L (ref 44–121)
BUN/Creatinine Ratio: 19 (ref 9–23)
BUN: 13 mg/dL (ref 6–24)
Bilirubin Total: 0.5 mg/dL (ref 0.0–1.2)
CO2: 23 mmol/L (ref 20–29)
Calcium: 10.1 mg/dL (ref 8.7–10.2)
Chloride: 105 mmol/L (ref 96–106)
Creatinine, Ser: 0.7 mg/dL (ref 0.57–1.00)
Globulin, Total: 2.3 g/dL (ref 1.5–4.5)
Glucose: 73 mg/dL (ref 70–99)
Potassium: 4.8 mmol/L (ref 3.5–5.2)
Sodium: 143 mmol/L (ref 134–144)
Total Protein: 6.9 g/dL (ref 6.0–8.5)
eGFR: 102 mL/min/{1.73_m2} (ref >59)

## 2023-06-28 LAB — LIPID PANEL
Chol/HDL Ratio: 2.7 ratio (ref 0.0–4.4)
Cholesterol, Total: 214 mg/dL — ABNORMAL HIGH (ref 100–199)
HDL: 78 mg/dL (ref >39)
LDL Chol Calc (NIH): 123 mg/dL — ABNORMAL HIGH (ref 0–99)
Triglycerides: 74 mg/dL (ref 0–149)
VLDL Cholesterol Cal: 13 mg/dL (ref 5–40)

## 2023-06-28 LAB — TSH: TSH: 1.36 u[IU]/mL (ref 0.450–4.500)

## 2023-06-28 LAB — VITAMIN D 25 HYDROXY (VIT D DEFICIENCY, FRACTURES): Vit D, 25-Hydroxy: 43.6 ng/mL (ref 30.0–100.0)

## 2023-07-18 ENCOUNTER — Ambulatory Visit
Admission: RE | Admit: 2023-07-18 | Discharge: 2023-07-18 | Disposition: A | Discharge status: Home / Self Care | Payer: BC Managed Care – PPO | Source: Ambulatory Visit | Attending: Family Medicine | Admitting: Family Medicine

## 2023-07-18 DIAGNOSIS — Z1231 Encounter for screening mammogram for malignant neoplasm of breast: Secondary | ICD-10-CM | POA: Diagnosis present

## 2024-06-27 ENCOUNTER — Encounter: Payer: BC Managed Care – PPO | Admitting: Family Medicine

## 2024-07-02 ENCOUNTER — Ambulatory Visit (INDEPENDENT_AMBULATORY_CARE_PROVIDER_SITE_OTHER)

## 2024-07-02 VITALS — BP 109/71 | HR 65 | Resp 16 | Ht 65.0 in | Wt 120.0 lb

## 2024-07-02 DIAGNOSIS — G43009 Migraine without aura, not intractable, without status migrainosus: Secondary | ICD-10-CM | POA: Diagnosis not present

## 2024-07-02 DIAGNOSIS — Z131 Encounter for screening for diabetes mellitus: Secondary | ICD-10-CM

## 2024-07-02 DIAGNOSIS — Z0000 Encounter for general adult medical examination without abnormal findings: Secondary | ICD-10-CM

## 2024-07-02 DIAGNOSIS — Z136 Encounter for screening for cardiovascular disorders: Secondary | ICD-10-CM

## 2024-07-02 DIAGNOSIS — Z0001 Encounter for general adult medical examination with abnormal findings: Secondary | ICD-10-CM | POA: Diagnosis not present

## 2024-07-02 DIAGNOSIS — Z1231 Encounter for screening mammogram for malignant neoplasm of breast: Secondary | ICD-10-CM

## 2024-07-02 DIAGNOSIS — Z23 Encounter for immunization: Secondary | ICD-10-CM | POA: Diagnosis not present

## 2024-07-02 MED ORDER — ONDANSETRON HCL 4 MG PO TABS: 4.0000 mg | ORAL_TABLET | Freq: Three times a day (TID) | ORAL | 0 refills | Status: AC | PRN

## 2024-07-02 MED ORDER — SUMATRIPTAN SUCCINATE 50 MG PO TABS: 50.0000 mg | ORAL_TABLET | ORAL | 0 refills | Status: AC | PRN

## 2024-07-02 NOTE — Progress Notes (Signed)
 Complete physical exam   Patient: Faith Banks   DOB: May 31, 1966   58 y.o. Female  MRN: [id number] Visit Date: 07/02/2024  Today's healthcare provider: Isaiah DELENA Pepper, MD   Chief Complaint  Patient presents with   Annual Exam    CPE/ No other concerns only wants the flu   Subjective    Faith Banks is a 58 y.o. female who presents today for a complete physical exam.  She reports consuming a general diet. Does morning workout class daily- Boot camp including weight training She generally feels well. She reports sleeping well. She does have additional problems to discuss today: migraines.  Discussed the use of AI scribe software for clinical note transcription with the patient, who gave verbal consent to proceed.  History of Present Illness Faith Banks is a 58 year old female who presents for an annual physical exam.  She experienced a wrist fracture in August, which has significantly improved, with much better range of motion now. No recent falls besides the wrist incident.  She has a history of migraines occurring at least once a month, typically lasting 24 hours, with throbbing pain localized to one area. She usually takes four Advil to dull the pain, which does not completely resolve it. Occasional nausea that improves with Zofran.  Her cholesterol was noted to be slightly elevated last year. She tries to maintain a healthy diet, avoiding fried foods and limiting red meat to once a week, but enjoys eggs and cheese.   She received two COVID-19 vaccinations but no booster due to adverse effects from the second dose. She also experienced sickness after the shingles vaccine and had a mild case of shingles shortly after vaccination.  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 She participates in a boot camp workout class at a women's gym, which includes weight training, every morning. She  denies any concerns about her sleep and reports no use of tobacco products. She drinks alcohol occasionally.  She has a family history of skin cancer, with her father and brother frequently affected due to their outdoor work. She undergoes regular dermatological checks and has not had any recent removals. She received a mammogram about a year ago and is due for another. She had a colonoscopy in 2019 and was advised to repeat it in ten years. No family history of breast cancer.   Last depression screening scores    07/02/2024   10:35 AM 06/27/2023    9:30 AM 06/24/2022    2:15 PM  PHQ 2/9 Scores  PHQ - 2 Score 0 0 0  PHQ- 9 Score 3  1   Last fall risk screening    06/27/2023    9:30 AM  Fall Risk   Falls in the past year? 0  Number falls in past yr: 0  Injury with Fall? 0  Risk for fall due to : No Fall Risks  Follow up Falls evaluation completed    Vision:Within last year and Dental: Receives regular dental care   Medications: Outpatient Medications Prior to Visit  Medication Sig   clobetasol cream (TEMOVATE) 0.05 % Apply 1 Application topically 2 (two) times daily.   Multiple Vitamin (MULTIVITAMIN) tablet Take 1 tablet by mouth daily.   No facility-administered medications prior to visit.    Review of Systems as noted in HPI.     Objective    BP 109/71 (BP  Location: Left Arm, Patient Position: Sitting, Cuff Size: Normal)   Pulse 65   Resp 16   Ht 5' 5 (1.651 m)   Wt 120 lb (54.4 kg)   SpO2 100%   BMI 19.97 kg/m    Physical Exam Constitutional:      Appearance: Normal appearance.  HENT:     Head: Normocephalic and atraumatic.     Mouth/Throat:     Mouth: Mucous membranes are moist.  Eyes:     Pupils: Pupils are equal, round, and reactive to light.  Cardiovascular:     Rate and Rhythm: Normal rate and regular rhythm.     Heart sounds: Normal heart sounds.  Pulmonary:     Effort: Pulmonary effort is normal.     Breath sounds: Normal breath sounds.   Skin:    General: Skin is warm.  Neurological:     General: No focal deficit present.     Mental Status: She is alert.      No results found for any visits on 07/02/24.  Assessment & Plan    Routine Health Maintenance and Physical Exam  Exercise Activities and Dietary recommendations Continue bootcamp workouts, incorporating strength and cardio, 5x weekly.  Immunization History  Administered Date(s) Administered   Influenza, Seasonal, Injecte, Preservative Fre 06/27/2023, 07/02/2024   Influenza-Unspecified 05/27/2021   Moderna Sars-Covid-2 Vaccination 10/26/2019, 11/23/2019   Tdap 06/24/2022   Zoster Recombinant(Shingrix ) 06/24/2022, 10/21/2022    Health Maintenance  Topic Date Due   Pneumococcal Vaccine: 50+ Years (1 of 1 - PCV) Never done   COVID-19 Vaccine (3 - 2025-26 season) 04/29/2024   Mammogram  07/17/2025   Cervical Cancer Screening (HPV/Pap Cotest)  06/01/2027   Colonoscopy  12/20/2027   DTaP/Tdap/Td (2 - Td or Tdap) 06/24/2032   Influenza Vaccine  Completed   Hepatitis C Screening  Completed   HIV Screening  Completed   Zoster Vaccines- Shingrix   Completed   HPV VACCINES  Aged Out   Meningococcal B Vaccine  Aged Out   Hepatitis B Vaccines 19-59 Average Risk  Discontinued    Discussed health benefits of physical activity, and encouraged her to engage in regular exercise appropriate for her age and condition.  Problem List Items Addressed This Visit   None Visit Diagnoses       Encounter for annual physical exam    -  Primary     Migraine without aura and without status migrainosus, not intractable       Relevant Medications   ondansetron (ZOFRAN) 4 MG tablet   SUMAtriptan (IMITREX) 50 MG tablet     Screening for diabetes mellitus (DM)       Relevant Orders   Hemoglobin A1c     Screening for cardiovascular condition       Relevant Orders   Lipid panel     Need for vaccination       Relevant Orders   Flu vaccine trivalent PF, 6mos and  older(Flulaval,Afluria,Fluarix,Fluzone) (Completed)     Screening mammogram for breast cancer       Relevant Orders   MM 3D SCREENING MAMMOGRAM BILATERAL BREAST      Assessment & Plan Adult Wellness Visit Routine visit. Due for cholesterol and diabetes screenings. Mammogram due, colonoscopy next due in 2029. - Ordered mammogram. - Administered flu shot. Currently out of PNA shot, will return for this. - Ordered lipid panel. - Ordered A1c test. - Discussed eating a balanced diet and incorporating movement into daily routine.   Migraine without aura  Chronic, uncontrolled. Migraines occur monthly, lasting over 24 hours with right-sided throbbing pain. Advil provides insufficient relief. - Prescribed sumatriptan for acute management, with instructions for dosing at onset and repeat after two hours if needed. Discussed side effects. - Prescribed Zofran for vertigo management.     Return in about 1 year (around 07/02/2025) for Annual Physical Exam.     Isaiah DELENA Pepper, MD  [facility] [phone] (phone) [phone] (fax)

## 2024-07-03 ENCOUNTER — Ambulatory Visit: Payer: Self-pay

## 2024-07-03 LAB — LIPID PANEL
Chol/HDL Ratio: 2.8 ratio (ref 0.0–4.4)
Cholesterol, Total: 207 mg/dL — ABNORMAL HIGH (ref 100–199)
HDL: 74 mg/dL (ref >39)
LDL Chol Calc (NIH): 117 mg/dL — ABNORMAL HIGH (ref 0–99)
Triglycerides: 88 mg/dL (ref 0–149)
VLDL Cholesterol Cal: 16 mg/dL (ref 5–40)

## 2024-07-03 LAB — HEMOGLOBIN A1C
Est. average glucose Bld gHb Est-mCnc: 103 mg/dL
Hgb A1c MFr Bld: 5.2 % (ref 4.8–5.6)

## 2024-07-19 ENCOUNTER — Ambulatory Visit
Admission: RE | Admit: 2024-07-19 | Discharge: 2024-07-19 | Disposition: A | Discharge status: Home / Self Care | Source: Ambulatory Visit

## 2024-07-19 DIAGNOSIS — Z1231 Encounter for screening mammogram for malignant neoplasm of breast: Secondary | ICD-10-CM | POA: Insufficient documentation

## 2025-07-04 ENCOUNTER — Encounter
